# Patient Record
Sex: Female | Born: 1980 | Race: White | Hispanic: No | Marital: Married | Smoking: Never smoker
Health system: Southern US, Community
[De-identification: ages and names within clinical notes are randomized; demographics above are authoritative.]

## PROBLEM LIST (undated history)

## (undated) DIAGNOSIS — E079 Disorder of thyroid, unspecified: Secondary | ICD-10-CM

---

## 2001-02-15 ENCOUNTER — Other Ambulatory Visit: Admission: RE | Admit: 2001-02-15 | Discharge: 2001-02-15 | Payer: Self-pay | Admitting: Family Medicine

## 2001-12-20 ENCOUNTER — Emergency Department (HOSPITAL_COMMUNITY): Admission: EM | Admit: 2001-12-20 | Discharge: 2001-12-20 | Payer: Self-pay | Admitting: Emergency Medicine

## 2002-03-31 ENCOUNTER — Other Ambulatory Visit: Admission: RE | Admit: 2002-03-31 | Discharge: 2002-03-31 | Payer: Self-pay | Admitting: Family Medicine

## 2002-08-24 ENCOUNTER — Encounter: Payer: Self-pay | Admitting: Emergency Medicine

## 2002-08-24 ENCOUNTER — Emergency Department (HOSPITAL_COMMUNITY): Admission: EM | Admit: 2002-08-24 | Discharge: 2002-08-24 | Payer: Self-pay | Admitting: Emergency Medicine

## 2003-05-12 ENCOUNTER — Encounter: Payer: Self-pay | Admitting: Emergency Medicine

## 2003-05-12 ENCOUNTER — Emergency Department (HOSPITAL_COMMUNITY): Admission: EM | Admit: 2003-05-12 | Discharge: 2003-05-12 | Payer: Self-pay | Admitting: Emergency Medicine

## 2003-07-23 ENCOUNTER — Emergency Department (HOSPITAL_COMMUNITY): Admission: AD | Admit: 2003-07-23 | Discharge: 2003-07-23 | Payer: Self-pay | Admitting: Emergency Medicine

## 2004-10-02 ENCOUNTER — Inpatient Hospital Stay (HOSPITAL_COMMUNITY): Admission: AD | Admit: 2004-10-02 | Discharge: 2004-10-05 | Payer: Self-pay | Admitting: Family Medicine

## 2004-11-19 ENCOUNTER — Other Ambulatory Visit: Admission: RE | Admit: 2004-11-19 | Discharge: 2004-11-19 | Payer: Self-pay | Admitting: Family Medicine

## 2004-12-10 ENCOUNTER — Emergency Department (HOSPITAL_COMMUNITY): Admission: EM | Admit: 2004-12-10 | Discharge: 2004-12-11 | Payer: Self-pay

## 2018-06-19 ENCOUNTER — Emergency Department (HOSPITAL_COMMUNITY)
Admission: EM | Admit: 2018-06-19 | Discharge: 2018-06-19 | Disposition: A | Discharge status: Home / Self Care | Payer: 59 | Attending: Emergency Medicine | Admitting: Emergency Medicine

## 2018-06-19 ENCOUNTER — Other Ambulatory Visit: Payer: Self-pay

## 2018-06-19 ENCOUNTER — Emergency Department (HOSPITAL_COMMUNITY): Payer: 59

## 2018-06-19 ENCOUNTER — Encounter (HOSPITAL_COMMUNITY): Payer: Self-pay | Admitting: Emergency Medicine

## 2018-06-19 DIAGNOSIS — Y999 Unspecified external cause status: Secondary | ICD-10-CM | POA: Insufficient documentation

## 2018-06-19 DIAGNOSIS — Y929 Unspecified place or not applicable: Secondary | ICD-10-CM | POA: Insufficient documentation

## 2018-06-19 DIAGNOSIS — X500XXA Overexertion from strenuous movement or load, initial encounter: Secondary | ICD-10-CM | POA: Insufficient documentation

## 2018-06-19 DIAGNOSIS — Y9301 Activity, walking, marching and hiking: Secondary | ICD-10-CM | POA: Diagnosis not present

## 2018-06-19 DIAGNOSIS — S8992XA Unspecified injury of left lower leg, initial encounter: Secondary | ICD-10-CM | POA: Diagnosis present

## 2018-06-19 DIAGNOSIS — W19XXXA Unspecified fall, initial encounter: Secondary | ICD-10-CM

## 2018-06-19 DIAGNOSIS — S82842A Displaced bimalleolar fracture of left lower leg, initial encounter for closed fracture: Secondary | ICD-10-CM | POA: Diagnosis not present

## 2018-06-19 HISTORY — DX: Disorder of thyroid, unspecified: E07.9

## 2018-06-19 MED ORDER — OXYCODONE HCL 5 MG PO TABS: 5.0000 mg | ORAL_TABLET | Freq: Four times a day (QID) | ORAL | 0 refills | Status: AC | PRN

## 2018-06-19 MED ORDER — OXYCODONE-ACETAMINOPHEN 5-325 MG PO TABS
2.0000 | ORAL_TABLET | Freq: Once | ORAL | Status: AC
  Administered 2018-06-19: 2 via ORAL
  Filled 2018-06-19: qty 2

## 2018-06-19 NOTE — ED Triage Notes (Signed)
Patient brought in via EMS. Alert and oriented. Airway patent. Patient c/o left ankle pain after falling today walking down hill. Patient states heard popping noise in ankle. Mild swelling per EMS, ankle splinted by paramedic. Denies hitting head or LOC.

## 2018-06-19 NOTE — ED Provider Notes (Addendum)
[facility] EMERGENCY DEPARTMENT Provider Note   CSN: [id number] Arrival date & time: 06/19/18  1208    History   Chief Complaint Chief Complaint  Patient presents with  . Ankle Pain    HPI Diane Mason is a 37 y.o. female.  HPI  37 year old female, she has a history of thyroid disease but no other significant long-term problems, she does not take any anticoagulants.  She presents after she had a fall while she was walking down a hill when her foot was everted rolling medially causing acute onset of pain and swelling and inability to ambulate.  This occurred approximately 1-1/2 hours ago, her pain is persistent, it is worse with any manipulation of the foot but not associated with any numbness or weakness.  The paramedics immobilized the patient in a lower extremity immobilizer keeping the ankle mortise stable and transported to the hospital.  Additional information from her husband corroborates that she was unable to walk and that there is slight deformity of the ankle.  Currently the patient's pain is moderate  Past Medical History:  Diagnosis Date  . Thyroid disease     There are no active problems to display for this patient.   History reviewed. No pertinent surgical history.   OB History    Gravida  2   Para  2   Term  2   Preterm      AB      Living        SAB      TAB      Ectopic      Multiple      Live Births               Home Medications    Prior to Admission medications   Medication Sig Start Date End Date Taking? Authorizing Provider  oxyCODONE (ROXICODONE) 5 MG immediate release tablet Take 1 tablet (5 mg total) by mouth every 6 (six) hours as needed for up to 3 days for severe pain. 06/19/18 06/22/18  Eber Hong, MD    Family History History reviewed. No pertinent family history.  Social History Social History   Tobacco Use  . Smoking status: Never Smoker  . Smokeless tobacco: Never Used  Substance Use Topics  . Alcohol  use: Never    Frequency: Never  . Drug use: Never     Allergies   Patient has no known allergies.   Review of Systems Review of Systems  Musculoskeletal: Positive for joint swelling.  Skin: Negative for wound.  Neurological: Negative for weakness and numbness.     Physical Exam Updated Vital Signs BP 128/78 (BP Location: Right Arm)   Pulse 84   Temp 98.9 F (37.2 C) (Oral)   Resp 18   Ht 5\' 8"  (1.727 m)   Wt 102.1 kg (225 lb)   SpO2 100%   BMI 34.21 kg/m   Physical Exam  Constitutional: She appears well-developed and well-nourished.  HENT:  Head: Normocephalic and atraumatic.  Eyes: Conjunctivae are normal. Right eye exhibits no discharge. Left eye exhibits no discharge.  Cardiovascular:  Normal pulses at the feet bilaterally  Pulmonary/Chest: Effort normal. No respiratory distress.  Musculoskeletal:  There is deformity at the left ankle with swelling and slight lateral displacement of the foot compared to the tibia.  Tenderness over both the medial and lateral malleoli  Neurological: She is alert. Coordination normal.  Sensation is intact, the patient is able to move all the toes, capillary refill  is normal  Skin: Skin is warm and dry. No rash noted. She is not diaphoretic. No erythema.  No breaks in the skin, no skin tenting  Psychiatric: She has a normal mood and affect.  Nursing note and vitals reviewed.    ED Treatments / Results  Labs (all labs ordered are listed, but only abnormal results are displayed) Labs Reviewed - No data to display  EKG None  Radiology Dg Ankle Complete Left  Result Date: 06/19/2018 CLINICAL DATA:  Status post secondary reduction EXAM: LEFT ANKLE COMPLETE - 3+ VIEW COMPARISON:  Films from earlier in the same day FINDINGS: Casting material is again seen. There has been interval reduction with better approximation of the fracture fragments and relocation of the talus with respect to the distal tibia. No new focal abnormality is  noted. IMPRESSION: Improved reduction of fracture fragments as well as the talus with respect to the distal tibia. Electronically Signed   By: Alcide CleverMark  Lukens M.D.   On: 06/19/2018 16:17   Dg Ankle Complete Left  Result Date: 06/19/2018 CLINICAL DATA:  Status post reduction EXAM: LEFT ANKLE COMPLETE - 3+ VIEW COMPARISON:  Films from earlier in the same day. FINDINGS: The distal tibial and fibular fractures are again identified and the displacement of the distal fracture fragments has increased following reduction. The degree of lateral displacement of the talus with respect to the distal tibia has also increased. There is now roughly 1 cm of lateral displacement of the talus. IMPRESSION: Status post reduction with increase in the displacement of the fracture fragments as well as the lateral displacement of the talus with respect to the distal tibia. Electronically Signed   By: Alcide CleverMark  Lukens M.D.   On: 06/19/2018 15:01   Dg Ankle Complete Left  Result Date: 06/19/2018 CLINICAL DATA:  37 year old female with a history of ankle injury EXAM: LEFT ANKLE COMPLETE - 3+ VIEW COMPARISON:  None. FINDINGS: Acute fracture of the medial malleolus with 6 mm of displacement. The talus is displaced slightly laterally on the mortise view. Oblique fracture of the distal fibula, which extends above the ankle mortise. No posterior malleolus fracture identified. Joint effusion. Significant circumferential soft tissue swelling. IMPRESSION: Acute fracture of the medial malleolus and distal fibula, with mild lateral displacement of the talus (Weber B, 4), and associated soft tissue swelling. Electronically Signed   By: Gilmer MorJaime  Wagner D.O.   On: 06/19/2018 13:11    Procedures Reduction of fracture Date/Time: 06/19/2018 2:34 PM Performed by: Eber HongMiller, Bernyce Brimley, MD Authorized by: Eber HongMiller, Toria Monte, MD  Consent: Verbal consent obtained. Risks and benefits: risks, benefits and alternatives were discussed Consent given by: patient Patient  understanding: patient states understanding of the procedure being performed Patient identity confirmed: verbally with patient Time out: Immediately prior to procedure a "time out" was called to verify the correct patient, procedure, equipment, support staff and site/side marked as required. Local anesthesia used: no  Anesthesia: Local anesthesia used: no  Sedation: Patient sedated: no  Patient tolerance: Patient tolerated the procedure well with no immediate complications Comments: The distal bimalleolar fracture was reduced at the bedside as the talus had start to slip laterally.  The patient tolerated both a stirrup and posterior splint that I personally placed, see separate note.   Marland Kitchen.Splint Application Date/Time: 06/19/2018 2:35 PM Performed by: Eber HongMiller, Marrell Dicaprio, MD Authorized by: Eber HongMiller, Jyrah Blye, MD   Consent:    Consent obtained:  Verbal   Consent given by:  Patient   Risks discussed:  Discoloration, numbness, pain and  swelling Pre-procedure details:    Sensation:  Normal   Skin color:  Nromal Procedure details:    Laterality:  Left   Location:  Ankle   Ankle:  L ankle   Splint type:  Ankle stirrup and short leg   Supplies:  Elastic bandage, Ortho-Glass and cotton padding Post-procedure details:    Pain:  Improved   Sensation:  Normal   Skin color:  Normal   Patient tolerance of procedure:  Tolerated well, no immediate complications Comments:         (including critical care time)  Medications Ordered in ED Medications  oxyCODONE-acetaminophen (PERCOCET/ROXICET) 5-325 MG per tablet 2 tablet (2 tablets Oral Given 06/19/18 1403)     Initial Impression / Assessment and Plan / ED Course  I have reviewed the triage vital signs and the nursing notes.  Pertinent labs & imaging results that were available during my care of the patient were reviewed by me and considered in my medical decision making (see chart for details).     I personally viewed and interpreted the  x-ray of the left ankle, she has no bony tenderness about the foot but does have both medial and lateral tenderness of the malleoli.  Her x-ray confirms that she does not fact have a bimalleolar fracture of both the medial malleoli and the lateral malleoli, this is technically a Weber B fracture.  She will need immobilization, the mortise is displaced slightly laterally on the oblique view thus she will need to have a fracture reduction and immobilization which I will perform.  She will be given 2 Percocet for pain control.  She has no tenderness about the knee or the proximal fibular head.  Doubt Maisonneuve fracture  After second attempt at reduction there was much better alignment of the bones of the ankle with an appropriately appearing mortise over the talus which is no longer dislocated.  The patient will need to have close follow-up with orthopedics.  She request to do this in her own medical system in [place].  She was given local follow-up as well for on-call orthopedist here.  I reviewed the [facility] database for opiate prescriptions, no signs of opiate abuse  Final Clinical Impressions(s) / ED Diagnoses   Final diagnoses:  Bimalleolar ankle fracture, left, closed, initial encounter    ED Discharge Orders        Ordered    oxyCODONE (ROXICODONE) 5 MG immediate release tablet  Every 6 hours PRN     06/19/18 1635       Eber Hong, MD 06/19/18 1639    Eber Hong, MD 06/19/18 [phone]

## 2018-06-19 NOTE — Discharge Instructions (Signed)
Your x-ray shows that the fracture has been appropriately reduced.  You will definitely need to have a surgical procedure to fix your ankle.  I would recommend that you call on Monday morning to arrange a follow-up with an orthopedic surgeon.  If you do not have an orthopedist you can see the phone number for the orthopedic surgeon on-call for this hospital today.  Please seek medical exam immediately for increasing pain swelling or numbness to your foot.  Please do not bear any weight on your foot, walk with crutches, do not let your foot touch the ground.  You may take oxycodone, 5 mg every 6 hours as needed for severe pain.  You should try to use Tylenol and ibuprofen first, maximum dose of Tylenol is 1000 mg 3 times a day and the maximum dose of ibuprofen is 800 mg 3 times a day.  Alternate these medicines every 4 hours.

## 2020-03-23 IMAGING — DX DG ANKLE COMPLETE 3+V*L*
3 series · 3 of 3 positions shown · non-contrast
Comparison: Films from earlier in the same day

CLINICAL DATA: Status post secondary reduction

EXAM:
LEFT ANKLE COMPLETE - 3+ VIEW

[ankle ap]
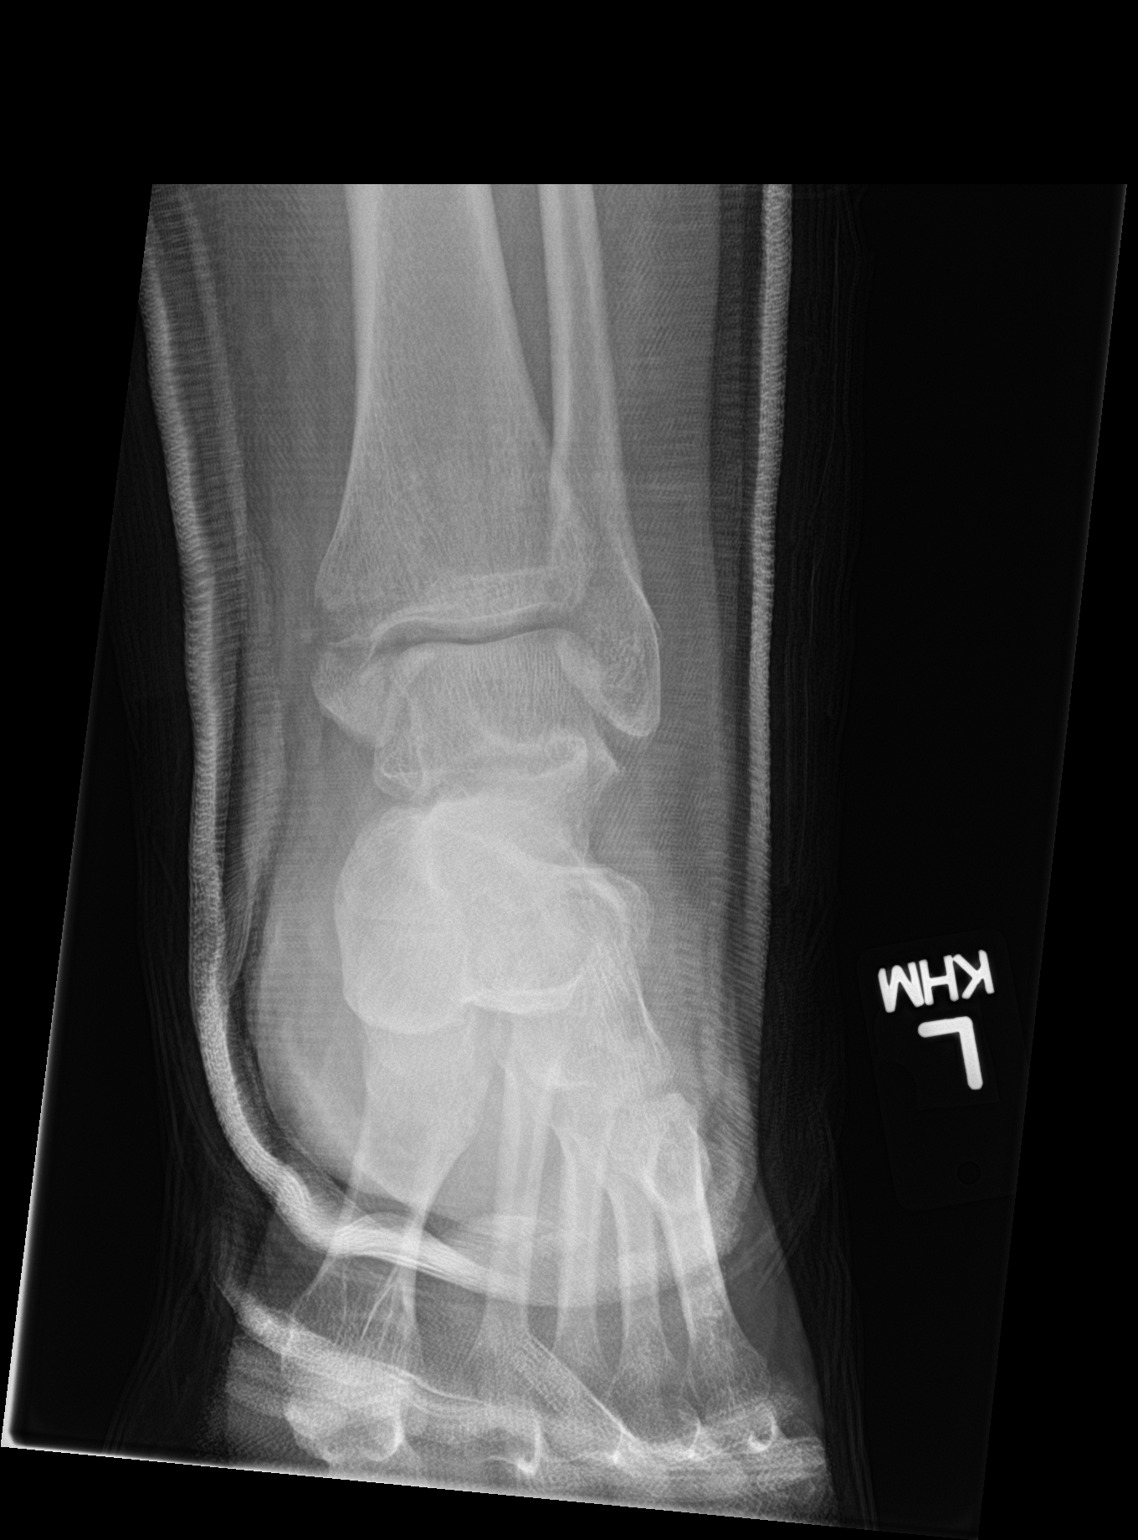

[ankle obl]
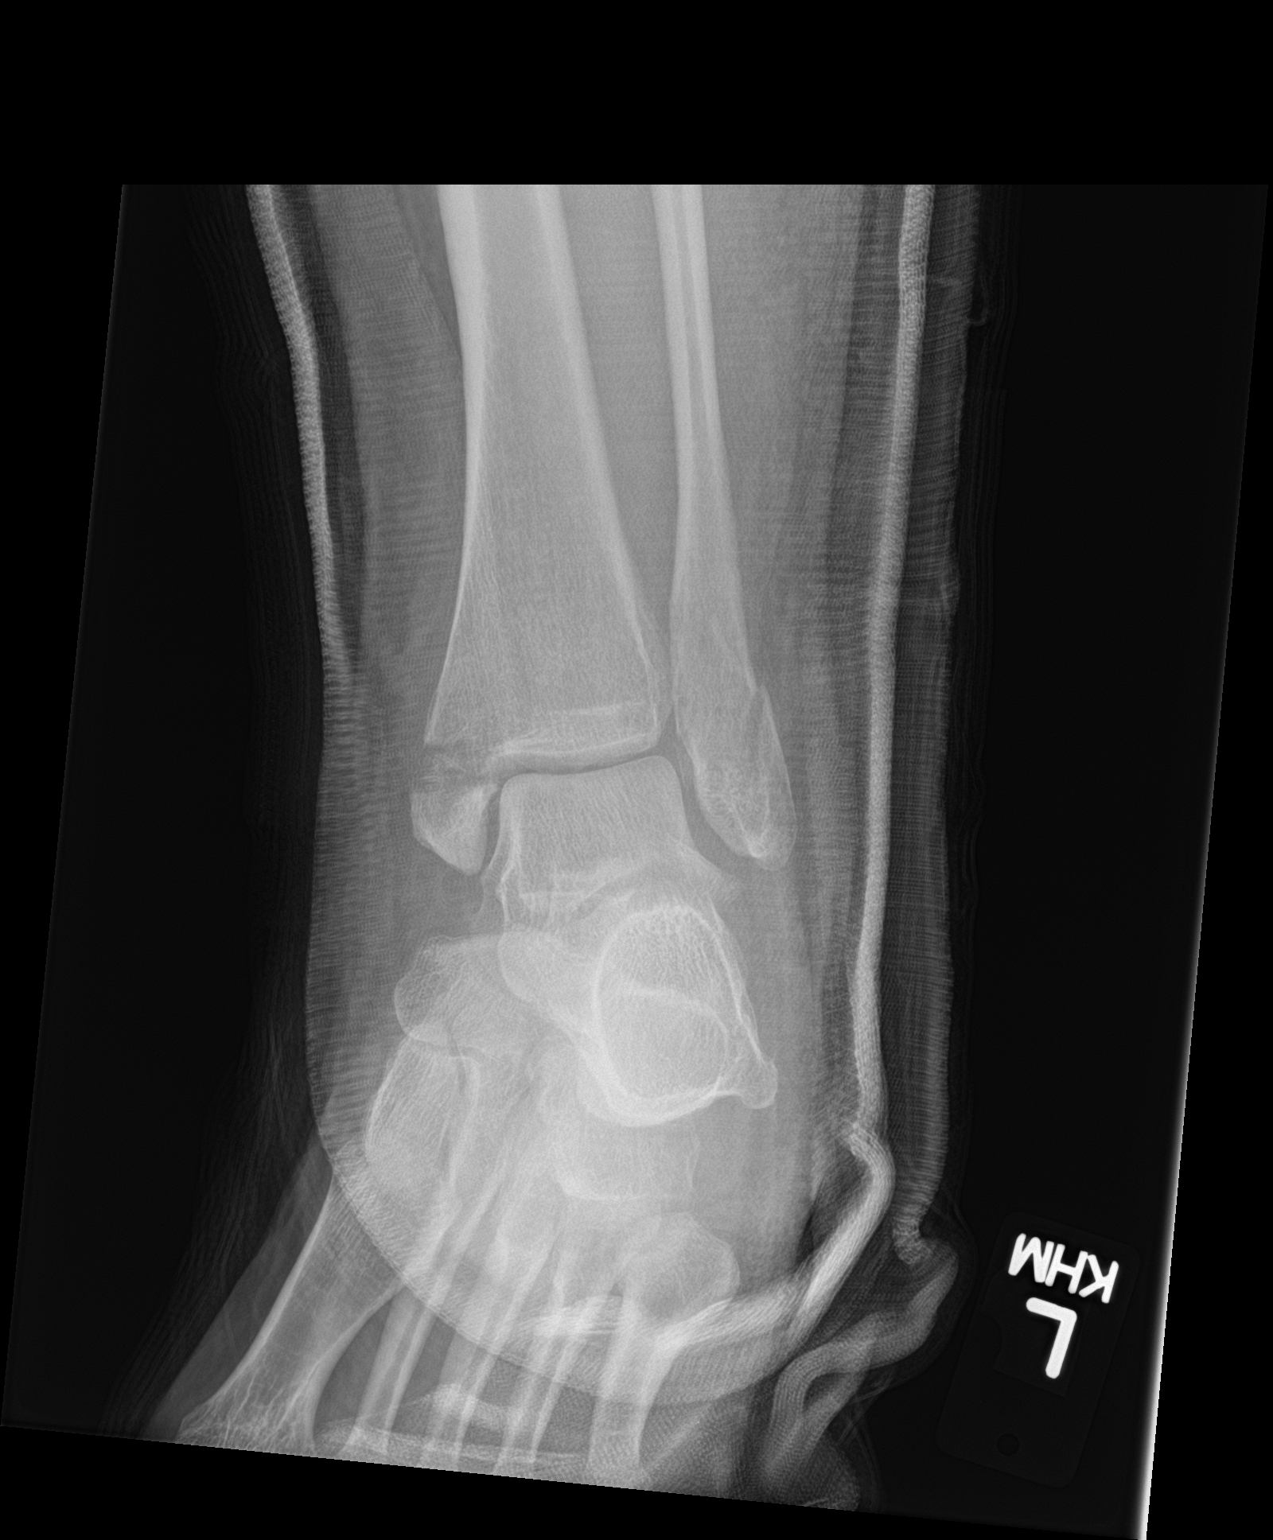

[ankle lat]
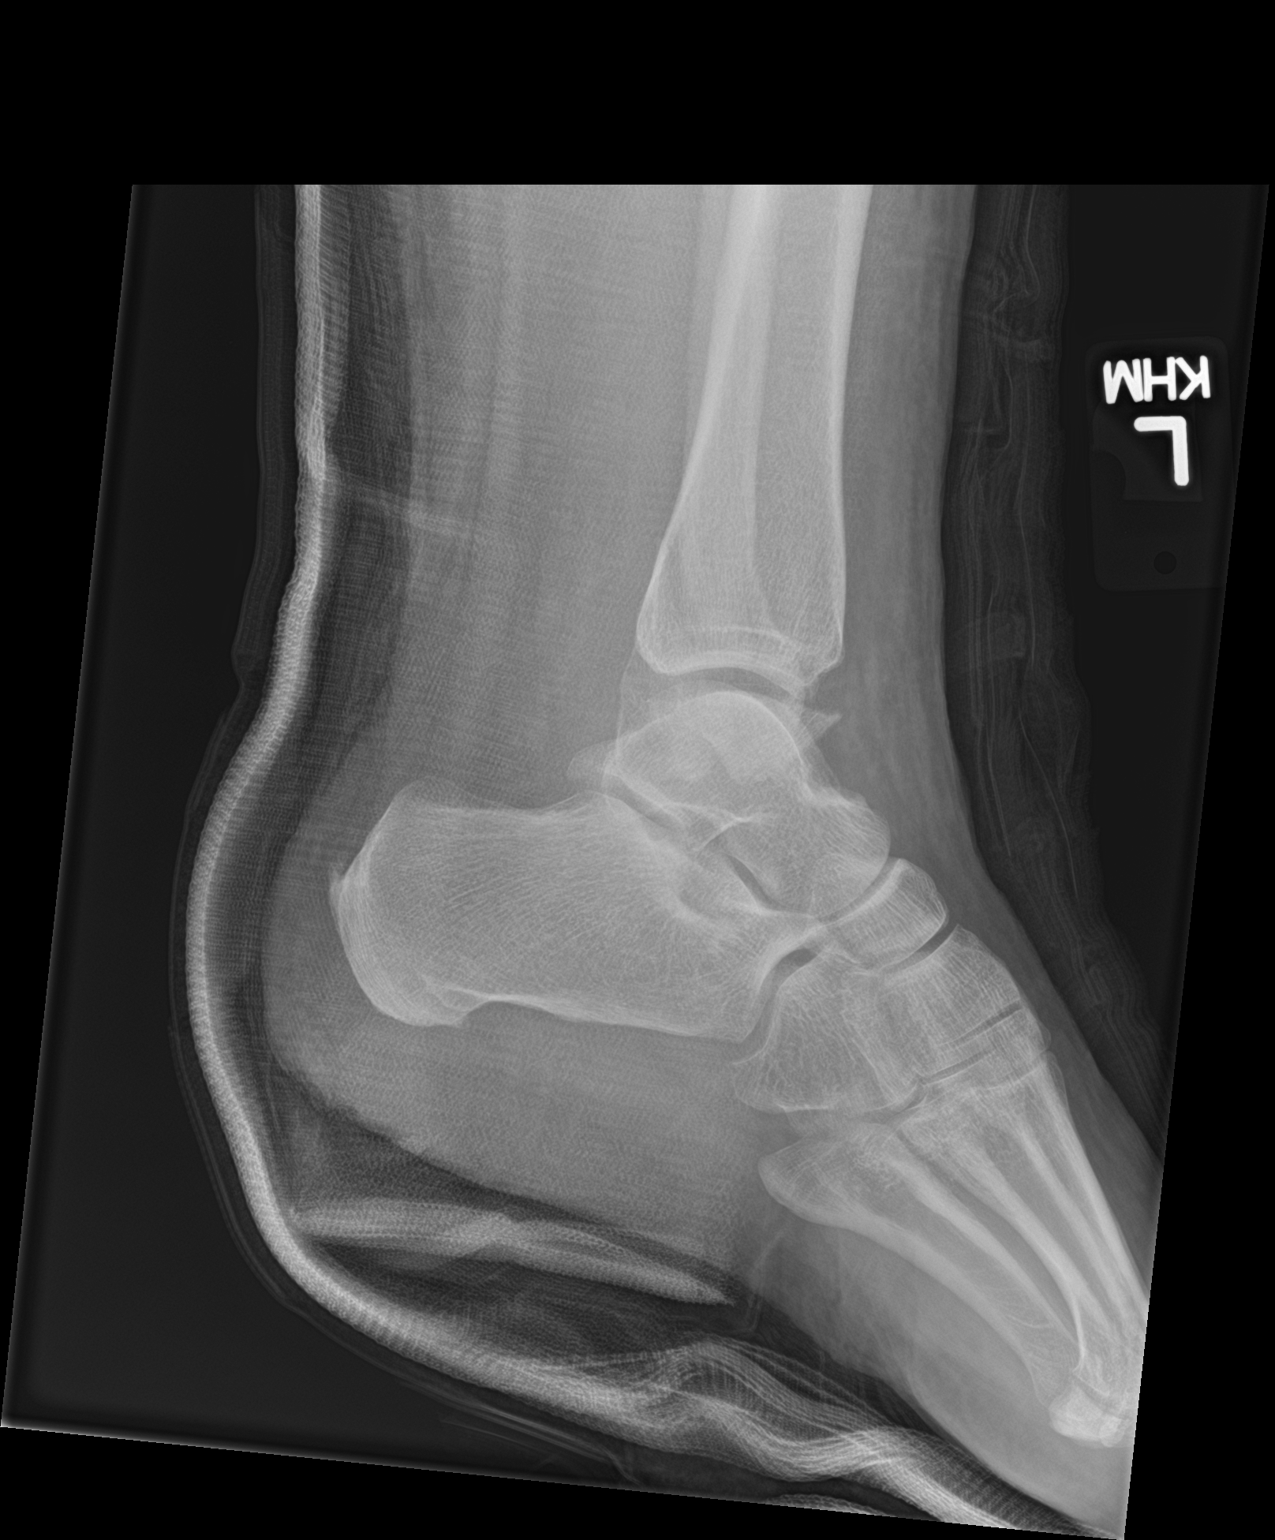

[3 of 3 positions shown; findings below may reference images not displayed]

FINDINGS: Casting material is again seen. There has been interval reduction
with better approximation of the fracture fragments and relocation
of the talus with respect to the distal tibia. No new focal
abnormality is noted.
IMPRESSION: Improved reduction of fracture fragments as well as the talus with
respect to the distal tibia.
